# Patient Record
Sex: Female | Born: 1940 | Race: White | Hispanic: No | Marital: Single
Health system: Southern US, Community
[De-identification: ages and names within clinical notes are randomized; demographics above are authoritative.]

---

## 2010-01-22 ENCOUNTER — Emergency Department: Payer: Self-pay | Admitting: Emergency Medicine

## 2010-03-01 ENCOUNTER — Inpatient Hospital Stay: Payer: Self-pay | Admitting: Psychiatry

## 2012-12-25 ENCOUNTER — Emergency Department: Payer: Self-pay | Admitting: Internal Medicine

## 2012-12-25 LAB — COMPREHENSIVE METABOLIC PANEL
Albumin: 3 g/dL — ABNORMAL LOW (ref 3.4–5.0)
Anion Gap: 3 — ABNORMAL LOW (ref 7–16)
BUN: 15 mg/dL (ref 7–18)
Bilirubin,Total: 0.4 mg/dL (ref 0.2–1.0)
Calcium, Total: 8.4 mg/dL — ABNORMAL LOW (ref 8.5–10.1)
Co2: 32 mmol/L (ref 21–32)
EGFR (African American): >60
EGFR (Non-African Amer.): 53 — ABNORMAL LOW
SGOT(AST): 24 U/L (ref 15–37)
SGPT (ALT): 26 U/L (ref 12–78)
Sodium: 142 mmol/L (ref 136–145)

## 2012-12-25 LAB — URINALYSIS, COMPLETE
Blood: NEGATIVE
Glucose,UR: NEGATIVE mg/dL (ref 0–75)
Ketone: NEGATIVE
Leukocyte Esterase: NEGATIVE
Nitrite: NEGATIVE
Ph: 7 (ref 4.5–8.0)
Protein: NEGATIVE
Specific Gravity: 1.006 (ref 1.003–1.030)

## 2012-12-25 LAB — CBC
HCT: 42.1 % (ref 35.0–47.0)
HGB: 14.1 g/dL (ref 12.0–16.0)
MCV: 90 fL (ref 80–100)
RDW: 13 % (ref 11.5–14.5)

## 2013-10-28 ENCOUNTER — Emergency Department: Payer: Self-pay | Admitting: Emergency Medicine

## 2013-10-28 LAB — COMPREHENSIVE METABOLIC PANEL
Albumin: 2.9 g/dL — ABNORMAL LOW (ref 3.4–5.0)
Alkaline Phosphatase: 68 U/L
Anion Gap: 5 — ABNORMAL LOW (ref 7–16)
BILIRUBIN TOTAL: 0.6 mg/dL (ref 0.2–1.0)
BUN: 33 mg/dL — AB (ref 7–18)
Calcium, Total: 8.9 mg/dL (ref 8.5–10.1)
Chloride: 98 mmol/L (ref 98–107)
Co2: 35 mmol/L — ABNORMAL HIGH (ref 21–32)
Creatinine: 1.52 mg/dL — ABNORMAL HIGH (ref 0.60–1.30)
EGFR (African American): 39 — ABNORMAL LOW
EGFR (Non-African Amer.): 34 — ABNORMAL LOW
Glucose: 95 mg/dL (ref 65–99)
Osmolality: 283 (ref 275–301)
Potassium: 3.9 mmol/L (ref 3.5–5.1)
SGOT(AST): 18 U/L (ref 15–37)
SGPT (ALT): 18 U/L (ref 12–78)
SODIUM: 138 mmol/L (ref 136–145)
Total Protein: 6.3 g/dL — ABNORMAL LOW (ref 6.4–8.2)

## 2013-10-28 LAB — BASIC METABOLIC PANEL
ANION GAP: 6 — AB (ref 7–16)
BUN: 29 mg/dL — ABNORMAL HIGH (ref 7–18)
CALCIUM: 8.4 mg/dL — AB (ref 8.5–10.1)
CO2: 34 mmol/L — AB (ref 21–32)
Chloride: 100 mmol/L (ref 98–107)
Creatinine: 1.31 mg/dL — ABNORMAL HIGH (ref 0.60–1.30)
EGFR (Non-African Amer.): 41 — ABNORMAL LOW
GFR CALC AF AMER: 47 — AB
GLUCOSE: 84 mg/dL (ref 65–99)
Osmolality: 284 (ref 275–301)
Potassium: 3.4 mmol/L — ABNORMAL LOW (ref 3.5–5.1)
Sodium: 140 mmol/L (ref 136–145)

## 2013-10-28 LAB — CBC WITH DIFFERENTIAL/PLATELET
Basophil #: 0 10*3/uL (ref 0.0–0.1)
Basophil %: 0.7 %
Eosinophil #: 0.1 10*3/uL (ref 0.0–0.7)
Eosinophil %: 1 %
HCT: 41.2 % (ref 35.0–47.0)
HGB: 13.4 g/dL (ref 12.0–16.0)
LYMPHS ABS: 1.3 10*3/uL (ref 1.0–3.6)
LYMPHS PCT: 23.7 %
MCH: 30.6 pg (ref 26.0–34.0)
MCHC: 32.6 g/dL (ref 32.0–36.0)
MCV: 94 fL (ref 80–100)
Monocyte #: 0.9 x10 3/mm (ref 0.2–0.9)
Monocyte %: 15.3 %
NEUTROS ABS: 3.3 10*3/uL (ref 1.4–6.5)
NEUTROS PCT: 59.3 %
PLATELETS: 120 10*3/uL — AB (ref 150–440)
RBC: 4.39 10*6/uL (ref 3.80–5.20)
RDW: 14.7 % — AB (ref 11.5–14.5)
WBC: 5.6 10*3/uL (ref 3.6–11.0)

## 2013-10-28 LAB — VALPROIC ACID LEVEL: Valproic Acid: 79 ug/mL

## 2013-10-28 LAB — TROPONIN I: Troponin-I: <0.02 ng/mL

## 2013-12-28 ENCOUNTER — Ambulatory Visit: Payer: Self-pay | Admitting: Internal Medicine

## 2014-01-20 ENCOUNTER — Inpatient Hospital Stay: Payer: Self-pay | Admitting: Internal Medicine

## 2014-01-20 LAB — CBC WITH DIFFERENTIAL/PLATELET
BASOS PCT: 0.2 %
Basophil #: 0 10*3/uL (ref 0.0–0.1)
EOS PCT: 0.1 %
Eosinophil #: 0 10*3/uL (ref 0.0–0.7)
HCT: 47.3 % — AB (ref 35.0–47.0)
HGB: 14.6 g/dL (ref 12.0–16.0)
LYMPHS ABS: 1.7 10*3/uL (ref 1.0–3.6)
LYMPHS PCT: 19.1 %
MCH: 30.3 pg (ref 26.0–34.0)
MCHC: 30.9 g/dL — ABNORMAL LOW (ref 32.0–36.0)
MCV: 98 fL (ref 80–100)
Monocyte #: 0.8 x10 3/mm (ref 0.2–0.9)
Monocyte %: 8.4 %
NEUTROS ABS: 6.5 10*3/uL (ref 1.4–6.5)
Neutrophil %: 72.2 %
Platelet: 178 10*3/uL (ref 150–440)
RBC: 4.82 10*6/uL (ref 3.80–5.20)
RDW: 16.2 % — ABNORMAL HIGH (ref 11.5–14.5)
WBC: 9 10*3/uL (ref 3.6–11.0)

## 2014-01-20 LAB — COMPREHENSIVE METABOLIC PANEL
AST: 42 U/L — AB (ref 15–37)
Albumin: 3.1 g/dL — ABNORMAL LOW (ref 3.4–5.0)
Alkaline Phosphatase: 63 U/L
Anion Gap: 10 (ref 7–16)
BILIRUBIN TOTAL: 0.7 mg/dL (ref 0.2–1.0)
BUN: 56 mg/dL — AB (ref 7–18)
CREATININE: 2.22 mg/dL — AB (ref 0.60–1.30)
Calcium, Total: 9.6 mg/dL (ref 8.5–10.1)
Chloride: 111 mmol/L — ABNORMAL HIGH (ref 98–107)
Co2: 35 mmol/L — ABNORMAL HIGH (ref 21–32)
EGFR (African American): 25 — ABNORMAL LOW
EGFR (Non-African Amer.): 21 — ABNORMAL LOW
Glucose: 109 mg/dL — ABNORMAL HIGH (ref 65–99)
OSMOLALITY: 325 (ref 275–301)
Potassium: 3.7 mmol/L (ref 3.5–5.1)
SGPT (ALT): 35 U/L
SODIUM: 156 mmol/L — AB (ref 136–145)
Total Protein: 6.8 g/dL (ref 6.4–8.2)

## 2014-01-20 LAB — TROPONIN I
Troponin-I: 0.07 ng/mL — ABNORMAL HIGH
Troponin-I: 0.05 ng/mL
Troponin-I: 0.06 ng/mL — ABNORMAL HIGH

## 2014-01-20 LAB — URINALYSIS, COMPLETE
BILIRUBIN, UR: NEGATIVE
Bacteria: NONE SEEN
Blood: NEGATIVE
GLUCOSE, UR: NEGATIVE mg/dL (ref 0–75)
Hyaline Cast: 8
Ketone: NEGATIVE
LEUKOCYTE ESTERASE: NEGATIVE
Nitrite: NEGATIVE
Ph: 5 (ref 4.5–8.0)
Protein: NEGATIVE
RBC,UR: 2 /HPF (ref 0–5)
SPECIFIC GRAVITY: 1.016 (ref 1.003–1.030)
WBC UR: 2 /HPF (ref 0–5)

## 2014-01-20 LAB — CK-MB
CK-MB: 2 ng/mL (ref 0.5–3.6)
CK-MB: 2 ng/mL (ref 0.5–3.6)
CK-MB: 1.8 ng/mL (ref 0.5–3.6)

## 2014-01-20 LAB — VALPROIC ACID LEVEL: Valproic Acid: 78 ug/mL

## 2014-01-21 LAB — CBC WITH DIFFERENTIAL/PLATELET
BASOS PCT: 0.1 %
Basophil #: 0 10*3/uL (ref 0.0–0.1)
EOS ABS: 0 10*3/uL (ref 0.0–0.7)
EOS PCT: 0.2 %
HCT: 38.1 % (ref 35.0–47.0)
HGB: 12.1 g/dL (ref 12.0–16.0)
Lymphocyte #: 1.7 10*3/uL (ref 1.0–3.6)
Lymphocyte %: 21.7 %
MCH: 30.8 pg (ref 26.0–34.0)
MCHC: 31.8 g/dL — ABNORMAL LOW (ref 32.0–36.0)
MCV: 97 fL (ref 80–100)
MONO ABS: 1 x10 3/mm — AB (ref 0.2–0.9)
Monocyte %: 12.8 %
NEUTROS PCT: 65.2 %
Neutrophil #: 5.2 10*3/uL (ref 1.4–6.5)
Platelet: 131 10*3/uL — ABNORMAL LOW (ref 150–440)
RBC: 3.94 10*6/uL (ref 3.80–5.20)
RDW: 15.7 % — ABNORMAL HIGH (ref 11.5–14.5)
WBC: 8 10*3/uL (ref 3.6–11.0)

## 2014-01-21 LAB — BASIC METABOLIC PANEL
ANION GAP: 8 (ref 7–16)
Anion Gap: 7 (ref 7–16)
BUN: 43 mg/dL — ABNORMAL HIGH (ref 7–18)
BUN: 35 mg/dL — ABNORMAL HIGH (ref 7–18)
CO2: 34 mmol/L — AB (ref 21–32)
Calcium, Total: 8.7 mg/dL (ref 8.5–10.1)
Calcium, Total: 7.9 mg/dL — ABNORMAL LOW (ref 8.5–10.1)
Chloride: 113 mmol/L — ABNORMAL HIGH (ref 98–107)
Chloride: 112 mmol/L — ABNORMAL HIGH (ref 98–107)
Co2: 31 mmol/L (ref 21–32)
Creatinine: 1.45 mg/dL — ABNORMAL HIGH (ref 0.60–1.30)
Creatinine: 1.34 mg/dL — ABNORMAL HIGH (ref 0.60–1.30)
EGFR (African American): 42 — ABNORMAL LOW
EGFR (Non-African Amer.): 39 — ABNORMAL LOW
GFR CALC AF AMER: 46 — AB
GFR CALC NON AF AMER: 36 — AB
Glucose: 123 mg/dL — ABNORMAL HIGH (ref 65–99)
Glucose: 124 mg/dL — ABNORMAL HIGH (ref 65–99)
Osmolality: 314 (ref 275–301)
Osmolality: 313 (ref 275–301)
POTASSIUM: 3.6 mmol/L (ref 3.5–5.1)
Potassium: 3.1 mmol/L — ABNORMAL LOW (ref 3.5–5.1)
SODIUM: 152 mmol/L — AB (ref 136–145)
Sodium: 153 mmol/L — ABNORMAL HIGH (ref 136–145)

## 2014-01-21 LAB — URINE CULTURE

## 2014-01-21 LAB — TSH: THYROID STIMULATING HORM: 1.04 u[IU]/mL

## 2014-01-22 LAB — BASIC METABOLIC PANEL
ANION GAP: 8 (ref 7–16)
BUN: 29 mg/dL — ABNORMAL HIGH (ref 7–18)
CO2: 30 mmol/L (ref 21–32)
Calcium, Total: 7.8 mg/dL — ABNORMAL LOW (ref 8.5–10.1)
Chloride: 108 mmol/L — ABNORMAL HIGH (ref 98–107)
Creatinine: 1.07 mg/dL (ref 0.60–1.30)
EGFR (African American): >60
EGFR (Non-African Amer.): 52 — ABNORMAL LOW
GLUCOSE: 118 mg/dL — AB (ref 65–99)
Osmolality: 297 (ref 275–301)
POTASSIUM: 3.2 mmol/L — AB (ref 3.5–5.1)
Sodium: 146 mmol/L — ABNORMAL HIGH (ref 136–145)

## 2014-01-22 LAB — ALBUMIN: Albumin: 1.9 g/dL — ABNORMAL LOW (ref 3.4–5.0)

## 2014-01-23 LAB — BASIC METABOLIC PANEL
ANION GAP: 7 (ref 7–16)
BUN: 23 mg/dL — ABNORMAL HIGH (ref 7–18)
CHLORIDE: 108 mmol/L — AB (ref 98–107)
CO2: 29 mmol/L (ref 21–32)
Calcium, Total: 7.5 mg/dL — ABNORMAL LOW (ref 8.5–10.1)
Creatinine: 0.97 mg/dL (ref 0.60–1.30)
EGFR (African American): >60
EGFR (Non-African Amer.): 58 — ABNORMAL LOW
GLUCOSE: 86 mg/dL (ref 65–99)
OSMOLALITY: 290 (ref 275–301)
POTASSIUM: 3.3 mmol/L — AB (ref 3.5–5.1)
SODIUM: 144 mmol/L (ref 136–145)

## 2014-01-24 LAB — CLOSTRIDIUM DIFFICILE(ARMC)

## 2014-01-25 LAB — CULTURE, BLOOD (SINGLE)

## 2014-01-28 ENCOUNTER — Ambulatory Visit: Payer: Self-pay | Admitting: Internal Medicine

## 2014-03-10 ENCOUNTER — Ambulatory Visit: Payer: Self-pay | Admitting: Family Medicine

## 2014-03-10 LAB — URINALYSIS, COMPLETE
Bilirubin,UR: NEGATIVE
Blood: NEGATIVE
Glucose,UR: NEGATIVE mg/dL (ref 0–75)
NITRITE: POSITIVE
Ph: 5 (ref 4.5–8.0)
Protein: 100
RBC,UR: 16 /HPF (ref 0–5)
SPECIFIC GRAVITY: 1.03 (ref 1.003–1.030)
SQUAMOUS EPITHELIAL: NONE SEEN

## 2014-03-10 LAB — COMPREHENSIVE METABOLIC PANEL
ALK PHOS: 81 U/L
ALT: 8 U/L — AB
Albumin: 2.3 g/dL — ABNORMAL LOW (ref 3.4–5.0)
BUN: 38 mg/dL — ABNORMAL HIGH (ref 7–18)
Bilirubin,Total: 0.5 mg/dL (ref 0.2–1.0)
CHLORIDE: 124 mmol/L — AB (ref 98–107)
Calcium, Total: 8.7 mg/dL (ref 8.5–10.1)
Co2: 30 mmol/L (ref 21–32)
Creatinine: 0.98 mg/dL (ref 0.60–1.30)
EGFR (African American): >60
GFR CALC NON AF AMER: 59 — AB
Glucose: 145 mg/dL — ABNORMAL HIGH (ref 65–99)
Potassium: 3.9 mmol/L (ref 3.5–5.1)
SGOT(AST): 17 U/L (ref 15–37)
Sodium: >160 mmol/L (ref 136–145)
TOTAL PROTEIN: 5.5 g/dL — AB (ref 6.4–8.2)

## 2014-03-10 LAB — CBC WITH DIFFERENTIAL/PLATELET
BASOS ABS: 0 10*3/uL (ref 0.0–0.1)
Basophil %: 0.3 %
Eosinophil #: 0 10*3/uL (ref 0.0–0.7)
Eosinophil %: 0.2 %
HCT: 44.8 % (ref 35.0–47.0)
HGB: 13.7 g/dL (ref 12.0–16.0)
Lymphocyte #: 3.3 10*3/uL (ref 1.0–3.6)
Lymphocyte %: 25.4 %
MCH: 30.5 pg (ref 26.0–34.0)
MCHC: 30.7 g/dL — ABNORMAL LOW (ref 32.0–36.0)
MCV: 99 fL (ref 80–100)
Monocyte #: 0.9 x10 3/mm (ref 0.2–0.9)
Monocyte %: 7.4 %
NEUTROS ABS: 8.6 10*3/uL — AB (ref 1.4–6.5)
NEUTROS PCT: 66.7 %
Platelet: 173 10*3/uL (ref 150–440)
RBC: 4.51 10*6/uL (ref 3.80–5.20)
RDW: 18.3 % — ABNORMAL HIGH (ref 11.5–14.5)
WBC: 12.8 10*3/uL — ABNORMAL HIGH (ref 3.6–11.0)

## 2014-03-12 LAB — URINE CULTURE

## 2014-06-30 DEATH — deceased

## 2014-09-20 NOTE — Consult Note (Signed)
Psychiatry: Follow-up for this [age] year old woman with dementia.  Information obtained from the patient and the current chart.  Also discussion with current nursing staff. assessment today the patient was arousable but did not engage in conversation.  Made only very brief eye contact.  She would say her name for me and answered one or 2 brief questions specifically asking me to pull her blanket up higher above her but she would not engage in any spontaneous discussion.  Affect blunted and flat.  Did not appear to be in any acute distress. staff report that she has been awake intermittently not particularly agitated.  Only seems to get agitated when they are treating her decubitus ulcer which is resumable he painful.  Patient is tolerating being off the Cogentin without any difficulties.  Doesn't seem to have worsened her Parkinson's symptoms.  Tolerating being off the low dose Seroquel as well. note reviewed.  No indication for any further change to treatment plan as far as psychiatry is concerned.  I intend to sign off of this case at this point.  Please reconsult if further psychiatric concerns arise. Dementia mixed etiology.  Electronic Signatures: Audery Amellapacs, Alyzabeth Pontillo T (MD)  (Signed on 27-Aug-15 22:16)  Authored  Last Updated: 27-Aug-15 22:16 by Audery Amellapacs, China Deitrick T (MD)

## 2014-09-20 NOTE — Consult Note (Signed)
Referring Physician:  Epifanio Lesches :   Primary Care Physician:  Epifanio Lesches : [facility], [facility], [address] [place], [place] [address]  Reason for Consult: Admit Date: 20-Jan-2014  Chief Complaint: altered mental status  Reason for Consult: altered mental status   History of Present Illness: History of Present Illness:   74 yo RHD F presents to [facility] from nursing home secondary to altered mental status.  Pt has been in a nursing home for the past few years due to inability to move.  Over the past month, pt has had significant decrease in PO intake and has lost 30 pounds.  Family feels like pt has been worse since they increased her seroquel and add Remeron.  Pt has been bedbound for almost a year and is dependent on all ADLs.    ROS:  Review of Systems   unobtainable secondary to dementia  Past Medical/Surgical Hx:  Dementia:   Seasonal allergies:   CVA:   Past Medical/ Surgical Hx:  Past Medical History reviewed by me as above   Past Surgical History reviewed by me as above   Home Medications: Medication Instructions Last Modified Date/Time  Klor-Con 20 mEq oral powder for reconstitution 1 packet(s) orally once a day 24-Aug-15 12:27  divalproex sodium 500 mg oral delayed release tablet 1 tab(s) orally 2 times a day 24-Aug-15 12:27  atorvastatin 20 mg oral tablet 1 tab(s) orally once a day (at bedtime)  24-Aug-15 12:27  levothyroxine 75 mcg (0.075 mg) oral tablet 1 tab(s) orally once a day 24-Aug-15 12:27  Exelon 9.5 mg/24 hr transdermal film, extended release 1 patch transdermal once a day 24-Aug-15 12:27  fluticasone nasal 50 mcg/inh nasal spray 2 spray(s) nasal once a day 24-Aug-15 12:27  loratadine 10 mg oral tablet 1 tab(s) orally once a day, As Needed for allergies 24-Aug-15 12:27  SEROquel 25 mg oral tablet 1 tab(s) orally once a day (in the morning) 24-Aug-15 12:27  Lasix 40 mg oral tablet 1 tab(s) orally  once a day at 2 pm as needed 24-Aug-15 12:27  omeprazole 20 mg oral delayed release capsule 1 cap(s) orally once a day (in the morning), As Needed - for Indigestion, Heartburn 24-Aug-15 12:27  furosemide 40 mg oral tablet 1 tab(s) orally once a day 24-Aug-15 12:27  QUEtiapine 100 mg oral tablet 1 tab(s) orally once a day (at bedtime) 24-Aug-15 12:27  LORazepam 0.5 mg oral tablet 1 tab(s) orally 2 times a day, As Needed for anxiety/pre-personal care 24-Aug-15 12:27  Mapap 325 mg oral tablet 2 tab(s) orally every 6 hours, As Needed - for Pain or fever 24-Aug-15 12:27   Allergies:  Sulfa drugs: Unknown  Allergies:  Allergies sulfa   Social/Family History: Employment Status: disabled  Lives With: alone  Living Arrangements: assisted living  Social History: no tob, no EtOH, no illicits  Family History: no seizures, no strokes   Vital Signs: **Vital Signs.:   24-Aug-15 17:40  Vital Signs Type Q 4hr  Temperature Temperature (F) 97.3  Celsius 36.2  Temperature Source oral  Pulse Pulse 100  Respirations Respirations 18  Systolic BP Systolic BP 951  Diastolic BP (mmHg) Diastolic BP (mmHg) 80  Mean BP 94  Pulse Ox % Pulse Ox % 96  Pulse Ox Activity Level  At rest  Oxygen Delivery Room Air/ 21 %   Physical Exam: General: slightly overweight, NAD  HEENT: normocephalic, sclera nonicteric, oropharynx clear  Neck: supple, no JVD, no bruits  Chest:  CTA B, no wheezing, good movement  Cardiac: RRR, no murmurs, no edema, 2+ pulses  Extremities: no C/C/E, FROM   Neurologic Exam: Mental Status: eyes open but does not follow, will slowly track, trace verbal output  Cranial Nerves: PERRLA, EOMI with slow saccades, nl VF, face symmetric with hypomimia, tongue midline, shoulder shrug equal  Motor Exam: 5 /5 B, severe tremor and tone in L UE, increased tone elsewhere as well, marked bradykinesia  Deep Tendon Reflexes: 1+/4 B, mute plantars  Sensory Exam: grimaces to pain in all 4 ext   Coordination: untestable   Lab Results: Hepatic:  24-Aug-15 08:28   Bilirubin, Total 0.7  Alkaline Phosphatase 63 (46-116 NOTE: New Reference Range 12/17/13)  SGPT (ALT) 35 (14-63 NOTE: New Reference Range 12/17/13)  SGOT (AST)  42  Total Protein, Serum 6.8  Albumin, Serum  3.1  TDMs:  24-Aug-15 08:28   Valproic Acid, Serum 78 (50-100 POTENTIALLY TOXIC:  > 200 mcg/mL)  Routine Micro:  24-Aug-15 08:46   Micro Text Report BLOOD CULTURE   COMMENT                   NO GROWTH IN 8-12 HOURS   ANTIBIOTIC                       Specimen Source #2 left arm  Culture Comment NO GROWTH IN 8-12 HOURS  Result(s) reported on 20 Jan 2014 at 05:00PM.  Routine Chem:  24-Aug-15 08:28   Result Comment VALPROIC ACID - SPECIMEN PAST STABILITY. INTERPRET  - RESULTS WITH CAUTION...DAS  Result(s) reported on 20 Jan 2014 at 12:14PM.  Glucose, Serum  109  BUN  56  Creatinine (comp)  2.22  Sodium, Serum  156  Potassium, Serum 3.7  Chloride, Serum  111  CO2, Serum  35  Calcium (Total), Serum 9.6  Osmolality (calc) 325  eGFR (African American)  25  eGFR (Non-African American)  21 (eGFR values <37m/min/1.73 m2 may be an indication of chronic kidney disease (CKD). Calculated eGFR is useful in patients with stable renal function. The eGFR calculation will not be reliable in acutely ill patients when serum creatinine is changing rapidly. It is not useful in  patients on dialysis. The eGFR calculation may not be applicable to patients at the low and high extremes of body sizes, pregnant women, and vegetarians.)  Anion Gap 10  Cardiac:  24-Aug-15 13:30   Troponin I 0.05 (0.00-0.05 0.05 ng/mL or less: NEGATIVE  Repeat testing in 3-6 hrs  if clinically indicated. >0.05 ng/mL: POTENTIAL  MYOCARDIAL INJURY. Repeat  testing in 3-6 hrs if  clinically indicated. NOTE: An increase or decrease  of 30% or more on serial  testing suggests a  clinically important change)  CPK-MB, Serum 2.0  (Result(s) reported on 20 Jan 2014 at 02:09PM.)  Routine UA:  24-Aug-15 08:23   Color (UA) Yellow  Clarity (UA) Clear  Glucose (UA) Negative  Bilirubin (UA) Negative  Ketones (UA) Negative  Specific Gravity (UA) 1.016  Blood (UA) Negative  pH (UA) 5.0  Protein (UA) Negative  Nitrite (UA) Negative  Leukocyte Esterase (UA) Negative (Result(s) reported on 20 Jan 2014 at 08:51AM.)  RBC (UA) 2 /HPF  WBC (UA) 2 /HPF  Bacteria (UA) NONE SEEN  Epithelial Cells (UA) 1 /HPF  Mucous (UA) PRESENT  Hyaline Cast (UA) 8 /LPF (Result(s) reported on 20 Jan 2014 at 08:51AM.)  Routine Hem:  24-Aug-15 08:28   WBC (CBC) 9.0  RBC (CBC)  4.82  Hemoglobin (CBC) 14.6  Hematocrit (CBC)  47.3  Platelet Count (CBC) 178  MCV 98  MCH 30.3  MCHC  30.9  RDW  16.2  Neutrophil % 72.2  Lymphocyte % 19.1  Monocyte % 8.4  Eosinophil % 0.1  Basophil % 0.2  Neutrophil # 6.5  Lymphocyte # 1.7  Monocyte # 0.8  Eosinophil # 0.0  Basophil # 0.0   Radiology Results: CT:    24-Aug-15 12:17, CT Head Without Contrast  CT Head Without Contrast   REASON FOR EXAM:    altered mental status with worsening tremors  COMMENTS:       PROCEDURE: CT  - CT HEAD WITHOUT CONTRAST  - Jan 20 2014 12:17PM     CLINICAL DATA:  Mental status deterioration and poor PO intake    EXAM:  CT HEAD WITHOUT CONTRAST    TECHNIQUE:  Contiguous axial images were obtained from the base of the skull  through the vertex without intravenous contrast.    COMPARISON:  Noncontrast CT scan of the brain of October 28, 2013  FINDINGS:  There is moderate diffuse cerebral and cerebellar atrophy with  compensatory ventriculomegaly. There is no shift of the midline.  There is no acute intracranial hemorrhage nor evidence of acute  ischemic change. There subcentimeter lacunar infarctions in the  basal ganglia bilaterally. There areno abnormal intracranial  calcifications.    The observed paranasal sinuses and mastoid air cells are  clear.  There is no acute skull fracture.     IMPRESSION:  There are stable changes of moderate diffuse atrophy chronic small  vessel ischemia. There is no intracranial mass effect nor evidence  of acute ischemic or hemorrhagic infarctions.  Electronically Signed    By: David  Martinique    On: 01/20/2014 12:29         Verified By: DAVID A. Martinique, M.D., MD   Impression/Recommendations: Recommendations:   prior notes reviewed by me and normal reviewed by and shows renal dysfunction   Parkinsonism-  due to unilateral tremor and severe bradykinesia, this is likely idiopathic and has been around for years;  this is also probably worsened by Seroquel and Remeron;  this could explain loss of interest and inability to walk Dementia-  severe but confounded by 1.;  this could be Parkisons related or Lewy Body dementia Agitation-  none present now d/c Remeron cut Seroquel dose in half start Cogentin 0.5 mg BID but doubt that it will help will likely replete dopamine tomorrow with Sinemet vs. Neupro if pt is NPO will need swallow evaluation needs IV fluids and hydration PRN seroquel 12.57m only for agitation will follow  Electronic Signatures: SJamison Neighbor(MD)  (Signed 24-Aug-15 17:59)  Authored: REFERRING PHYSICIAN, Primary Care Physician, Consult, History of Present Illness, Review of Systems, PAST MEDICAL/SURGICAL HISTORY, HOME MEDICATIONS, ALLERGIES, Social/Family History, NURSING VITAL SIGNS, Physical Exam-, LAB RESULTS, RADIOLOGY RESULTS, Recommendations   Last Updated: 24-Aug-15 17:59 by SJamison Neighbor(MD)

## 2014-09-20 NOTE — H&P (Signed)
PATIENT NAME:  Sandra Levine, GOLINSKI MR#:  [id number] DATE OF BIRTH:  12-11-1940  DATE OF ADMISSION:  01/20/2014  PRIMARY DOCTOR: From [facility] making [facility].    CHIEF COMPLAINT: Altered mental status.   HISTORY OF PRESENT ILLNESS: The patient is a 74 year old female patient with dementia, comes in from a [facility] because of altered mental status. According to the family, the patient lost 30 pounds in 1 month. She also has decreased appetite for about 3 to 4 weeks. The patient noted to have some speech troubles and decreased mobility and decreased responsiveness that has been getting worse for about a month. The patient also noted to have tremors which are more worse for 5 days. According to family, she was started on Remeron 5 days ago for her appetite. Since then, she is having tremors and not responsive. The family said that Seroquel dose also was increased a week ago because of her agitation. The patient right now noted to have hypernatremia and acute renal failure. The patient was seen in the Emergency Room on June 1 and was discharged same day from ER after she had a fall and the patient was given a wheelchair and she is no wheelchair bound. The patient had a CT head at that time when she came on June 1, and all the tests were normal and the patient's Depakote level was also normal. Right now, the patient says that she wants to eat. She is poorly responsive and does have resting tremors of both hands and some neck tremors.   PAST MEDICAL HISTORY: Significant for hypothyroidism, dementia with more problems, hyperlipidemia, history of pedal edema, anxiety, history of multiple TIAs before.   MEDICATIONS AT NURSING HOME:  Atorvastatin 20 mg daily, Depakote sprinkles 125 mg 4 capsules p.o. b.i.d., Exelon patch 9.5 mg daily, fluticasone nasal spray 50 mcg 2 sprays daily, Lasix 40 mg daily, potassium chloride 20 mEq p.o. b.i.d., levothyroxine 75 mcg p.o. daily, Loratidine 10 mg daily,  Ativan 0.5 mg p.o. b.i.d. as needed for anxiety, remeron 7.5 mg at bedtime for a week then increase to 50 mg at bedtime, Seroquel 100 mg daily at bedtime and Seroquel 20 mg in the morning. According to the family the (Remeron was started a week ago.   ALLERGIES: KNOWN ALLERGY TO SULFA DRUGS.   SOCIAL HISTORY: No smoking. No drinking. No drugs.   REVIEW OF SYSTEMS: According to family, she did not have any recent fever or diarrhea or cough. The patient did not have any chest pain, but the patient unable to tell me any review of systems.   PHYSICAL EXAMINATION: VITAL SIGNS: Temperature 97.5, heart rate 93, blood pressure 120/80,  98)% on room air.  GENERAL: The patient is awake but slow to respond and has like a muffled voice. HEENT: Normocephalic, atraumatic. Slight head and neck tremors are noted.. The pupils are equal and reacting to light. No conjunctival pallor. No scleral icterus. Nose: No lesions. No drainage. Ears.no tympanic membrane congestion,. NECK: Supple no masses. Thyroid in the midline. Normal range of motion. RESPIRATORY: clear to auscultation.no wheeze. CARDIOVASCULAR: S1, S2 regular. No murmurs.  GASTROINTESTINAL: Has mild tenderness in the suprapubic area. No hepatosplenomegaly. Bowel sounds are present. Abdomen is soft. MUSCULOSKELETAL: The patient does have 2+ pitting edema all the way to the knees. Gait not tested.  SKIN: Inspection normal. The patient has no skin breakdown. LYMPHATIC: No lymphadenopathy in the neck or axillary regions.  VASCULAR: Pedal pulses not palpable secondary to 3+ pitting  edema.  NEUROLOGIC: Cranial nerves II through XII are intact. Power is somewhat limited because the patient is not able to follow commands and not able to assess further neurological exam due to her cognitive deficit.  PSYCHIATRIC: The patient has dementia, some cognitive deficits.   LABORATORY DATA: Creatinine was 1.52 on June 1. Sodium was 138 on June 1. Today, sodium of 156,  potassium 3.7, creatinine 2.2 and BUN 56 and bicarbonate 35 chloride 111. White count 9, hemoglobin 14.6, hematocrit 47.3, platelets 178,000. Lactic acid is 3.9. The patient's troponins are slightly elevated at 0.07. (UA  not infected. Leukocyte esterase negative and nitrite negative, bacteria none.   The patient's EKG  shows NSR with some PVCs at 100 beats per minute.   ASSESSMENT AND PLAN: 1. The patient is a 74 year old female hypernatremia, acute renal failure secondary to poor oral intake, evidenced of weight loss of 30 pounds in a month. Admit her to hospitalist service. Start her on D5 with intravenous fluids and  recheck sodium  in 8hrs. Obtain nephrology consult for hypernatremia and renal failure. Also fluid adjustment because she also has pedal edema.  2. Metabolic encephalopathy secondary to hypernatremia, dehydration, acute renal failure, but the patient has slurred speech and also some bradykinesia. We will get a CAT scan of the head and neurology consult to evaluate for possible Parkinson disease.  3. metabolic encephalopathy:  she is more lethargic after she was started on Remeron and increased dose of Seroquel. So, we are going to hold Seroquel and also Remeron. Obtain psychiatry consult for medication adjustment. 4.  Urine retention. She is probably having urine retention due to her medications and also her mental status. She already got a Foley and 1 liter of urine obtained. Continue Foley and watch how she does.  5. Slurred speech and choking episode. The patient was coughing when emergency room nurse tried to give water. We will get a CT of the head to rule out new strokes and also get speech therapy to evaluate.  6. Deconditioning. The patient will get physical therapy evaluation.  7. Other diagnoses include pedal edema. She is on Lasix but now she is dehydrated. We will get nephrology consult for medication management and fluid management.  8. Hypothyroidism. Continue Synthroid.   9. History of dementia. She is on Depakote and we will get Depakote levels.  10. Gastrointestinal prophylaxis and deep vein thrombosis prophylaxis.   TIME SPENT: About 60 minutes.  CODE: Full code.   Discussed the plan with patient's son and daughter    ____________________________ Katha HammingSnehalatha Vasco Chong, MD sk:NT D: 01/20/2014 11:25:55 ET T: 01/20/2014 12:30:05 ET JOB#: [id number]  cc: Katha HammingSnehalatha Tanishi Nault, MD, <Dictator> Katha HammingSNEHALATHA Sriram Febles MD ELECTRONICALLY SIGNED 02/10/2014 10:06

## 2014-09-20 NOTE — Consult Note (Signed)
Brief Consult Note: Diagnosis: Dementia with behavioral disturbance.   Patient was seen by consultant.   Consult note dictated.   Recommend further assessment or treatment.   Orders entered.   Comments: Psychiatry: Consult for this 74 year old woman with dementia.  Concern about medication.  History obtained from the chart and interview with the patient's son.  Patient was sound asleep and unarousable.  Reviewed medication.  No clear indication for Cogentin.  Not usually used as a standard Parkinson's medicine.  Tends to be very cognitively impairing.  Also sedating.  Also patient was given a small dose of Seroquel in the morning.  I'm not sure what the rationale was for a small dose of plain Seroquel given only in the morning.  Probably just sedating her during the day.  Discontinued that as well.  I will follow-up during the hospital stay.  Electronic Signatures: Audery Amellapacs, John T (MD)  (Signed 25-Aug-15 17:05)  Authored: Brief Consult Note   Last Updated: 25-Aug-15 17:05 by Audery Amellapacs, John T (MD)

## 2014-09-20 NOTE — Discharge Summary (Signed)
PATIENT NAME:  Sandra Levine, Drena G MR#:  [id number] DATE OF BIRTH:  May 15, 1941  DATE OF ADMISSION:  01/20/2014 DATE OF DISCHARGE:    DISCHARGE DIAGNOSES:  1. Acute renal failure secondary to poor p.o. intake. 2. Hyponatremia due to dehydration from poor p.o. intake.  3. Hypokalemia.  4. Metabolic encephalopathy.  5. Severe malnutrition.  6. Parkinson dementia.  7. Hypothyroidism.  8. Hyperlipidemia.   DISCHARGE MEDICATIONS:  1. Atorvastatin 20 mg daily.  2. Levothyroxine 75 mcg p.o. daily.  3. Exelon patch 9.5 mg daily transdermally.  4. Flonase 50 mcg 2 sprays daily.  5. Loratadine 10 mg daily as needed.  6. Omeprazole 20 mg p.o. daily.  7. Mapap 325 mg 2 tablets every 6 hours as needed for pain and fever.  8. Depakote 500 mg p.o. b.i.d.  9. Witch hazel to the buttock hemorrhoid area twice daily and as needed as well.  10. Amantadine 100 mg every 12 hours.  11. Carbidopa and levodopa combination 25/100 one tablet 3 times daily. These 2 are new medications. 12. Flomax 0.4 mg daily.  13. Colace and Bisacodyl as needed for constipation.   THE PATIENT IS STOPPED ON THE FOLLOWING MEDICATIONS: 1. Seroquel. 2. Lasix.  3. Ativan.  4. Kay Ciel.   DIET: Mechanical soft diet with thin liquids aspiration precautions.   CODE STATUS: DNR.  The patient also on Might Shakes on all meal trays and also Magic Cup on every tray.  CONSULTATIONS: Neurology consult with Dr. Mellody DrownMatthew Smith, physical therapy consults, speech therapy consults, psychiatry consults with Dr. Toni Amendlapacs and a nephrology consult with Dr. Mady HaagensenMunsoor Lateef, and social worker consult.  LABORATORY DATA: EKG on admission showed  with a junctional rhythm with some PVCs 100 beats per minute.   Urinalysis showed no infection. Depakote levels were 78. Urine cultures no growth. The patient's troponin slightly up at 0.07 and electrolytes on admission: Sodium 156, potassium 3.7, chloride is 111, bicarbonate 35, BUN 56, creatinine 2.22,  glucose 109. WBC on admission 9, hemoglobin 14.6, hematocrit 47.3, platelets 178,000. The patient's blood cultures were negative for 2 days.   Chest x-ray on admission showed no acute cardiopulmonary abnormality. Head CAT scan showed stable changes with diffuse atrophy, chronic small vessel changes. No mass effect.   TSH is 1.04, albumin 1.9. Clostridium difficile is negative and most recent Chem-7 yesterday showed sodium of 144, potassium 3.3, chloride 108, bicarbonate 29, BUN 23, creatinine 0.97, glucose 86.   DISCHARGE VITALS SIGNS: Temperature 98.5, heart rate 68, blood pressure is 190/79, sats 94 on room air.   HOSPITAL COURSE:  1. Acute renal failure. There is a 74 year old female patient [facility] because of worsening tremors, trouble speaking, decreased p.o. intake, and weight loss of 30 pounds in 1 month. The patient admitted to hospitalist service for acute renal failure and metabolic encephalopathy. Please look at the history and physical for full details. The patient admitted to telemetry, started on IV fluids. The patient had hyponatremia, hypokalemia. The patient thought to have hyponatremia, hypokalemia, acute renal failure secondary to poor p.o. intake and dehydration and patient has dementia and some behavioral problems. [facility]. According to family, she was not eating and drinking and having tremors and difficulty speaking, which is going on for about a week to 10 days and the patient thought to have renal failure secondary to poor p.o. intake and seen by nephrology and renal failure improved with IV fluids and  hypernatremia is corrected with fluids alone and the patient did receive some D5 and she does have some edema secondary to third spacing secondary to severe hypoalbuminemia. The patient Lasix is stopped at discharge secondary to poor p.o. intake and dehydration and possible recurrence of hypernatremia and renal  failure.  2. Altered mental status. The patient thought to have metabolic encephalopathy secondary to renal failure, hyponatremia,  and also worsening of her dementia. The patient was seen by Dr. Mellody Drown and also Dr. Toni Amend. Patient has increased tone and tremors and some slurred speech so Dr. 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 He advised we start her on Sinemet 25/100 t.i.d. and also amantadine. The patient's Seroquel and Cogentin have been stopped and the patient right now off Cogentin and Seroquel. She is on Sinemet and also amantadine. The plan is to continue those 2 medications and see with [facility] in 2 weeks. The patient also is seen by psychiatry, Dr. Toni Amend, for possible oversedation and also tremors. The patient was on Seroquel and Cogentin before she came and Seroquel dose has been increased by doctors making house calls so Dr. Toni Amend has seen the patient and we have stopped Seroquel and Cogentin since admission and the patient's mental status improved and she became a little more awake and oriented than admission. The patient remained off of Seroquel and Cogentin. Dr. Toni Amend said that is a reasonable decision and he did not have further recommendations as she is not agitated during the hospital stay and there was no reason to restart those medications. So at this time, we will continue only amantadine and Sinemet and now at [facility] the patient can have Seroquel only at the lower dose at 12.5 only for agitation as needed, but not on a regular basis.  2. Severe malnutrition. She is seen by speech therapy and also dietitian. We had a calorie count for 3 days. The patient started on Magic Cups twice a day and also Mighty Shakes t.i.d. The patient's p.o. intake improved from day of admission but still labile partly because of her severe dementia. She is also seen by Natalia Leatherwood from speech therapy and  she recommended mechanical soft diet / 3. Physical therapy has seen the patient regarding deconditioning and unable to ambulate. They recommended short-term rehab versus SNF and will need long-term care placement. Please look at their assessment note and they also mentioned that patient has potential for possible gains in strength and function to return to closer to the baseline and the son is agreeable for this plan and they decided that patient can go to [facility] and son has accepted bed offered at to [facility]. She has also seen the palliative care and code status has been changed to DNR. Son is very involved in the patient's care and we updated him daily.  TIME SPENT ON THIS DISCHARGE SUMMARY: More than 30 minutes.  She will see Dr. Mellody Drown per [facility] in 1-2 weeks.   ____________________________ Katha Hamming, MD sk:lt D: 01/24/2014 09:55:51 ET T: 01/24/2014 13:27:22 ET JOB#: [id number]  cc: Katha Hamming, MD, <Dictator> Katha Hamming MD ELECTRONICALLY SIGNED 02/05/2014 16:03

## 2014-09-20 NOTE — Consult Note (Signed)
Psychiatry: Follow-up note for this patient with Parkinson's disease and dementia.  I was consulted yesterday because of concerns on the part of the family about oversedation.  I discontinued her Seroquel and her Cogentin.  I see from the neurology note from Dr. Katrinka BlazingSmith today that he found her to be a little bit more awake and alert than previously.  On my exam today the patient was awake which was an improvement compared to yesterday.  She still is minimally verbal.  She was not able to hold a real conversation.  She told me that she was having some pain in her back but otherwise couldn't answer questions even being unable to tell me her full name.  Her attention wandered almost immediately.  Made only occasional eye contact.  Did not appear to be acting in a manner that was suicidal or violent or dangerous.  I don't see any indication that she has been particularly agitated today. least so far it has been a success to discontinue some of her sedating medicines.  It is possible that this might lead to more overall alert is hopefully without any increase in agitation.  No further changes to medicine today.  Continue to follow as needed.  Electronic Signatures: Audery Amellapacs, John T (MD)  (Signed on 26-Aug-15 18:18)  Authored  Last Updated: 26-Aug-15 18:18 by Audery Amellapacs, John T (MD)

## 2016-01-12 IMAGING — CT CT HEAD WITHOUT CONTRAST
1 series · 16 of 30 positions shown, 20 images · non-contrast
Comparison: January 22, 2010

CLINICAL DATA: Pain post trauma

EXAM:
CT HEAD WITHOUT CONTRAST
TECHNIQUE: Contiguous axial images were obtained from the base of the skull
through the vertex without intravenous contrast.

[Series 2: head wo · axial · 0.40mm/px · z∈[-117,+9]mm · 16 of 32 slices shown, 20 images]
[im 2/32  brain]
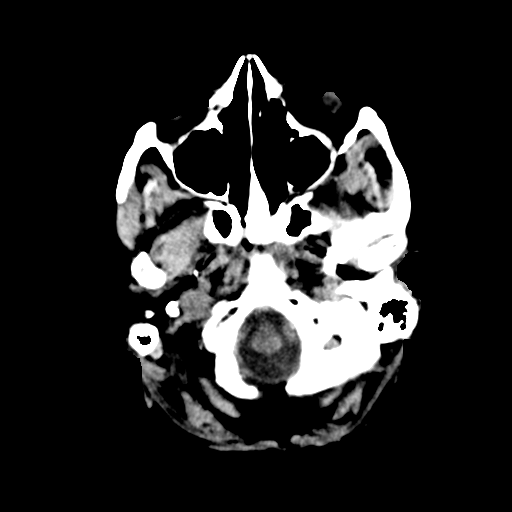
[im 2/32  bone]
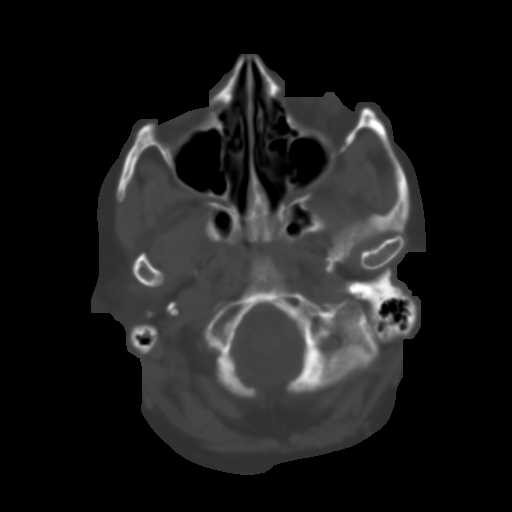
[im 4/32  brain]
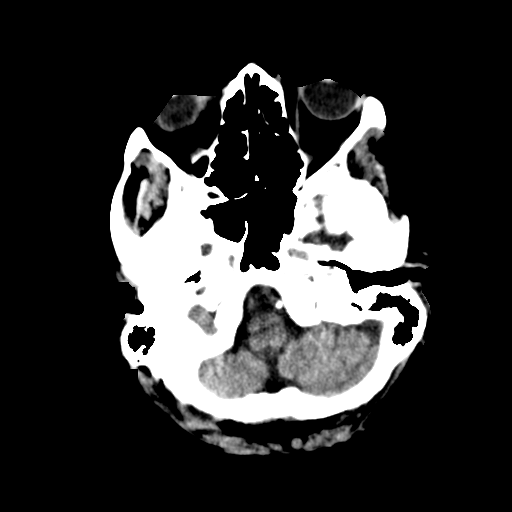
[im 6/32  brain]
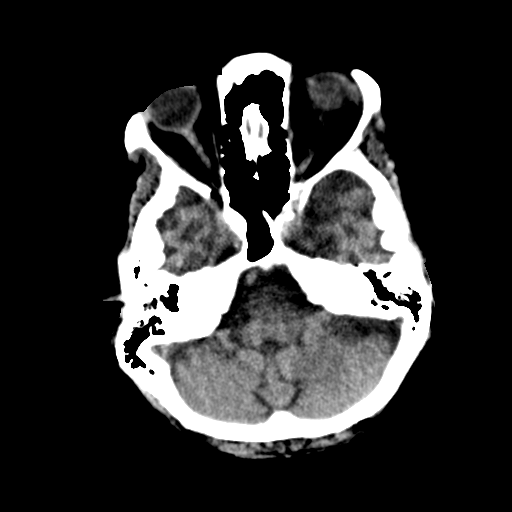
[im 8/32  brain]
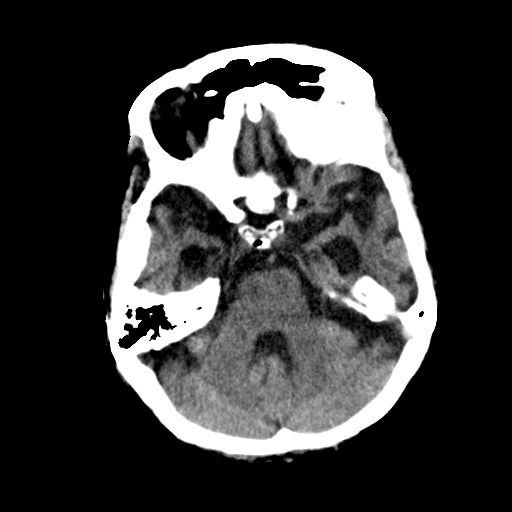
[im 9/32  brain]
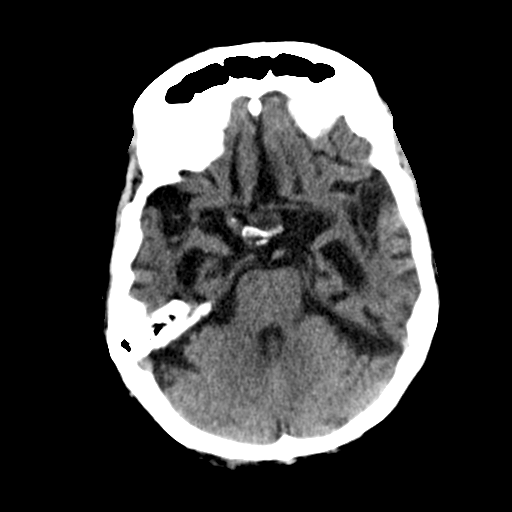
[im 9/32  bone]
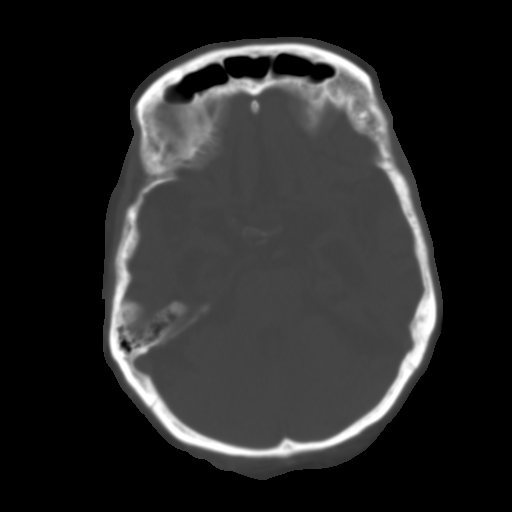
[im 11/32  brain]
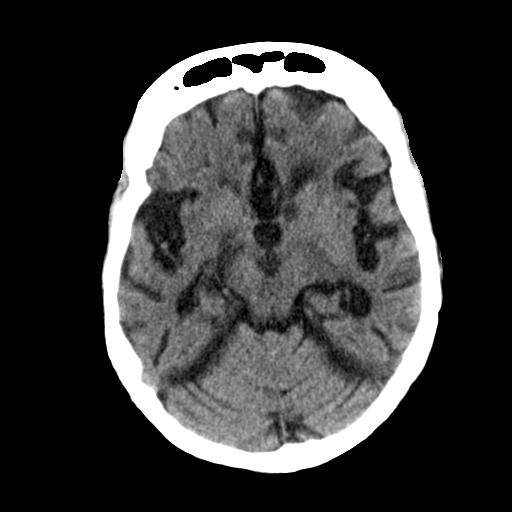
[im 13/32  brain]
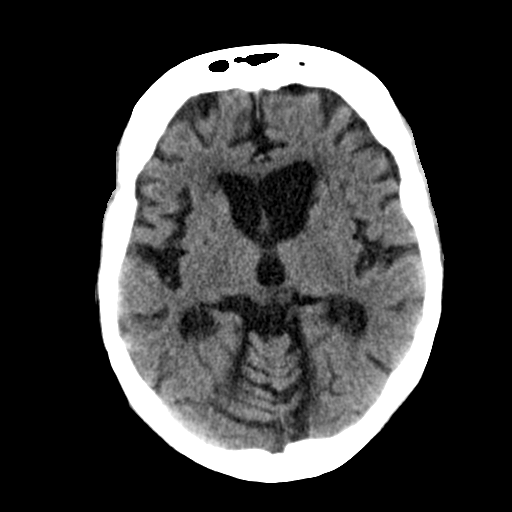
[im 15/32  brain]
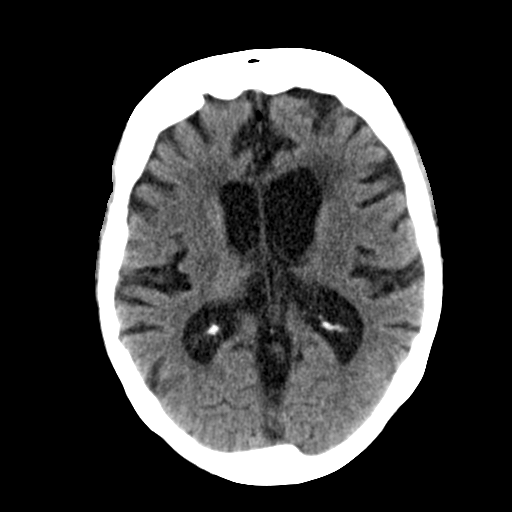
[im 17/32  brain]
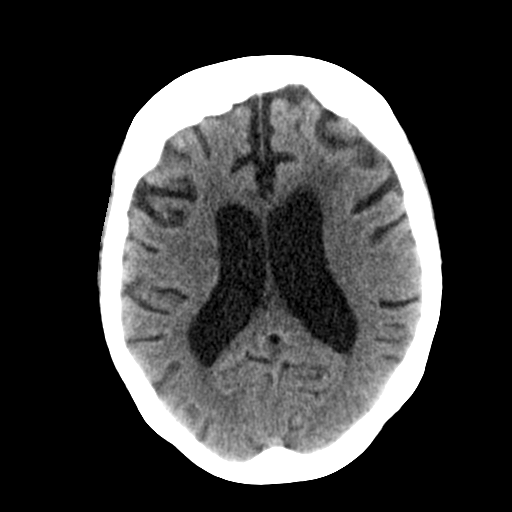
[im 17/32  bone]
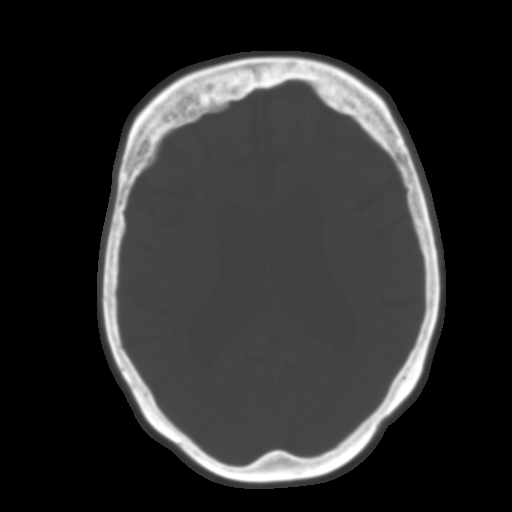
[im 19/32  brain]
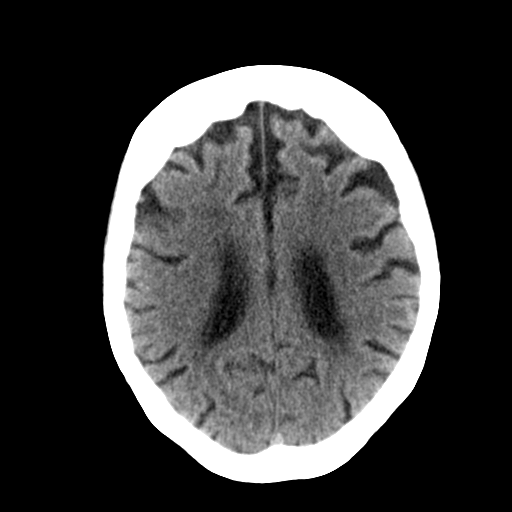
[im 21/32  brain]
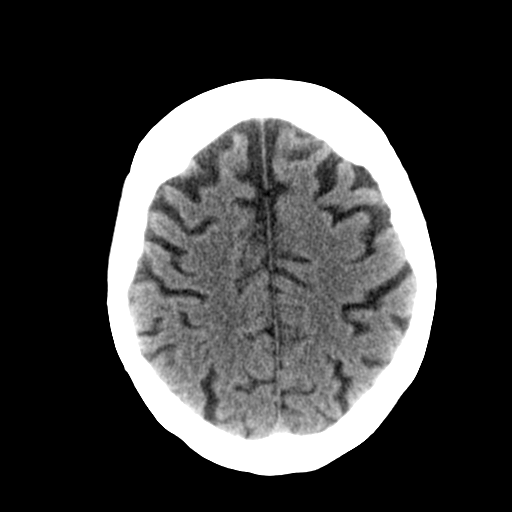
[im 23/32  brain]
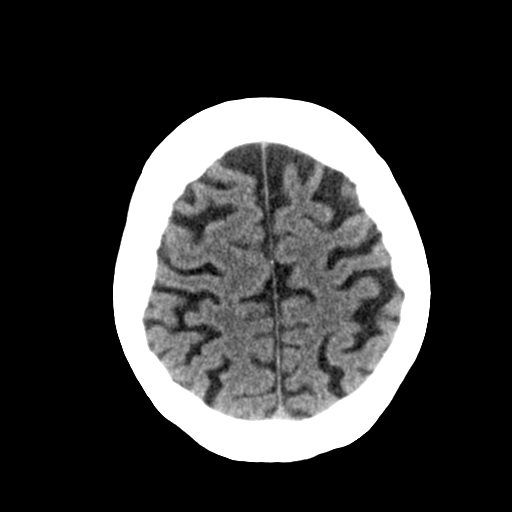
[im 24/32  brain]
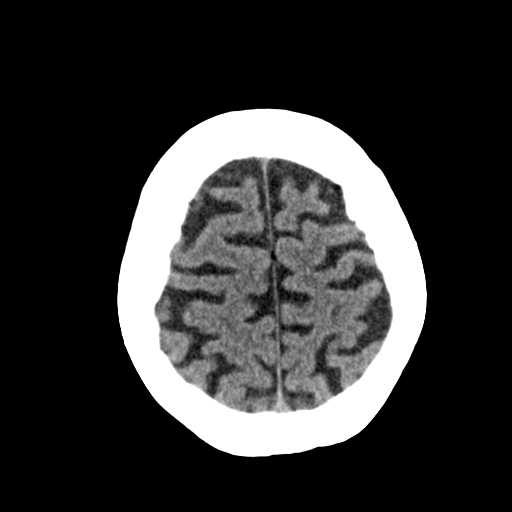
[im 24/32  bone]
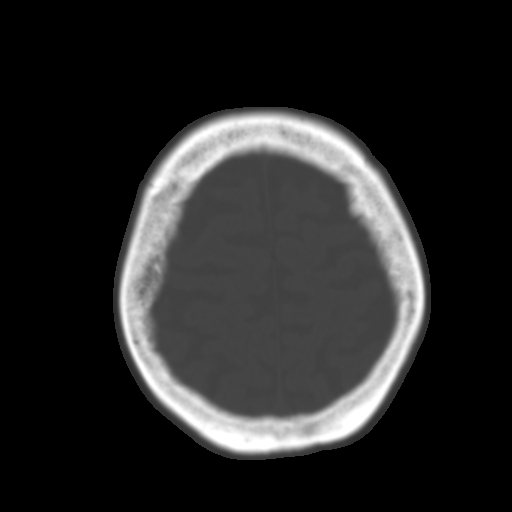
[im 26/32  brain]
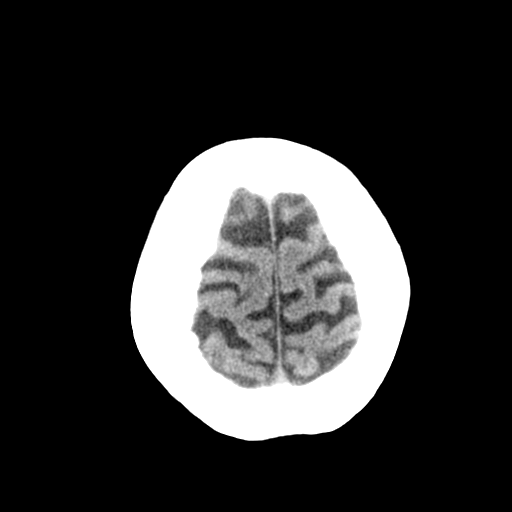
[im 28/32  brain]
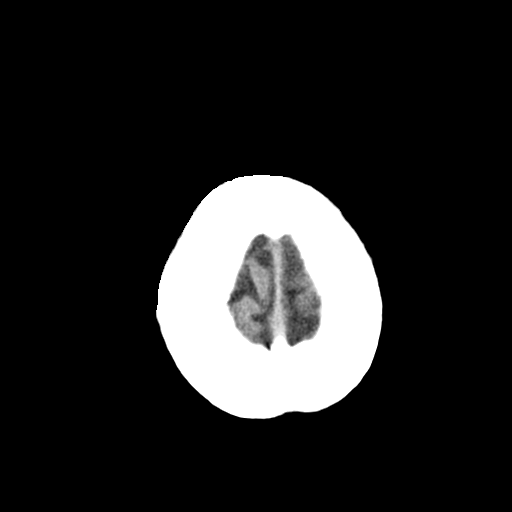
[im 30/32  brain]
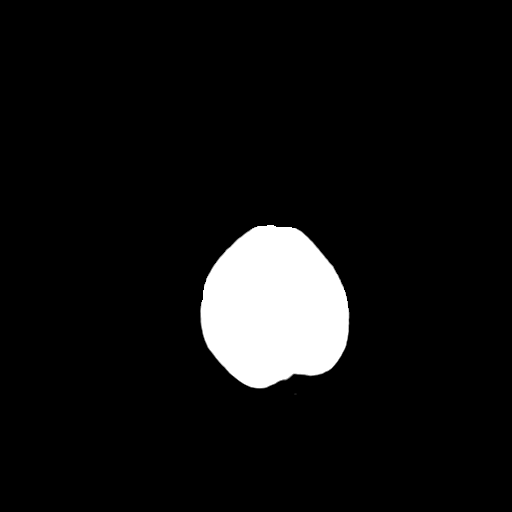

[16 of 30 positions shown; findings below may reference images not displayed]

FINDINGS: Moderate diffuse atrophy is stable. There is no mass, hemorrhage,
extra-axial fluid collection, or midline shift. There is patchy
small vessel disease in the centra semiovale bilaterally, stable.
There is evidence of a prior lacunar type infarct in the head of the
caudate nucleus on the left, stable. There is no new gray-white
compartment lesion. No acute appearing infarct present. Bony
calvarium appears intact. The mastoid air cells are clear.
IMPRESSION: Atrophy with supratentorial small vessel disease, stable. Small
prior lacunar infarct in the head of the caudate nucleus on the
left, stable. There is no intracranial mass, hemorrhage, or
extra-axial fluid.
